# Patient Record
Sex: Female | Born: 1950 | Race: White | Hispanic: No | Marital: Married
Health system: Southern US, Community
[De-identification: ages and names within clinical notes are randomized; demographics above are authoritative.]

---

## 2005-02-01 ENCOUNTER — Ambulatory Visit: Payer: Self-pay

## 2005-09-02 ENCOUNTER — Ambulatory Visit: Payer: Self-pay | Admitting: Family Medicine

## 2006-06-29 ENCOUNTER — Ambulatory Visit (HOSPITAL_COMMUNITY): Admission: RE | Admit: 2006-06-29 | Discharge: 2006-06-29 | Payer: Self-pay | Admitting: Gastroenterology

## 2007-09-28 ENCOUNTER — Ambulatory Visit: Payer: Self-pay | Admitting: Family Medicine

## 2009-04-28 ENCOUNTER — Ambulatory Visit: Payer: Self-pay | Admitting: Family Medicine

## 2009-05-13 ENCOUNTER — Ambulatory Visit: Payer: Self-pay | Admitting: Rheumatology

## 2009-08-01 ENCOUNTER — Inpatient Hospital Stay (HOSPITAL_COMMUNITY): Admission: RE | Admit: 2009-08-01 | Discharge: 2009-08-02 | Payer: Medicare Other | Admitting: Neurosurgery

## 2011-03-13 LAB — HEPATIC FUNCTION PANEL
Albumin: 4 g/dL (ref 3.5–5.2)
Total Protein: 7.6 g/dL (ref 6.0–8.3)

## 2011-03-13 LAB — GLUCOSE, CAPILLARY
Glucose-Capillary: 157 mg/dL — ABNORMAL HIGH (ref 70–99)
Glucose-Capillary: 188 mg/dL — ABNORMAL HIGH (ref 70–99)
Glucose-Capillary: 273 mg/dL — ABNORMAL HIGH (ref 70–99)

## 2011-03-13 LAB — BASIC METABOLIC PANEL
BUN: 25 mg/dL — ABNORMAL HIGH (ref 6–23)
CO2: 29 mEq/L (ref 19–32)
Calcium: 10.2 mg/dL (ref 8.4–10.5)
Creatinine, Ser: 1.06 mg/dL (ref 0.4–1.2)
GFR calc Af Amer: >60 mL/min (ref >60)
Glucose, Bld: 174 mg/dL — ABNORMAL HIGH (ref 70–99)

## 2011-03-13 LAB — CBC
MCHC: 33.2 g/dL (ref 30.0–36.0)
RDW: 15.6 % — ABNORMAL HIGH (ref 11.5–15.5)

## 2011-04-20 NOTE — Op Note (Signed)
[name], [name]               ACCOUNT NO.:  [id number]   MEDICAL RECORD NO.:  [id number]          PATIENT TYPE:  INP   LOCATION:  3524                         FACILITY:  [facility]   PHYSICIAN:  Reinaldo Meeker, M.D. 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   PROCEDURE:  Left T6-T7 transpedicular diskectomy with the operating  microscope.   SURGEON:  Reinaldo Meeker, MD   ASSISTANT:  Kathaleen Maser. Pool, MD   PROCEDURE IN DETAIL:  After placing in the prone position, the patient's  back was prepped and draped in the usual sterile fashion.  Localized  fluoroscopy was used to identify the appropriate level.  A linear  incision was made over the spinous processes of T6 and T7.  Using Bovie  cutting current, the incision was carried down to the spinous processes.  Subperiosteal dissection was then carried out alongside of the left-  sided spinous processes and lamina.  Self-retaining retractor was placed  for exposure.  Fluoroscopy showed approach to be at the appropriate  level.  High-speed drill was then used to perform a generous laminotomy  by removing the inferior one-half of the T6 lamina, the medial third of  the facet joint, and superior one-half of the T7 segment.  Ligamentum  flavum was removed.  Additional bone removal was carried out towards the  foramen to give access to the lateral aspect of the thecal sac.  At this  time, the microscope was draped, brought in the field, and used for the  remainder of the case.  Using microsection technique, the lateral aspect  of the thecal sac could be identified.  There was a huge amount of disk  material directly beneath it, elevated and pushed medially.  We began  to  remove this in a piecemeal fashion and a very large amount of herniated  disk material was removed and as we did more, the spinal dura returned  to a normal shape, size, and position.  We inspected all of them, went  down to the pedicle at T7, removed the disk from down there, and also  went up higher removing disk at all directions including the medial  underneath the thecal sac.  At this time, inspection was carried out in  all directions for any evidence of residual compression, none could be  identified.  Large amounts of irrigation was carried out.  Any bleeding  was controlled with bipolar coagulation and Gelfoam.  The wound was then  closed in multiple layers of Vicryl in the muscle, fascia, subcutaneous,  and subcuticular tissues.  The staples were placed on the skin.  Sterile  dressing was then applied.  The patient was extubated and taken to the  recovery room in stable condition.           ______________________________  Reinaldo Meeker, M.D.  ROK/MEDQ  D:  08/01/2009  T:  08/02/2009  Job:  [id number]

## 2011-04-23 NOTE — Op Note (Signed)
[name], [name]               ACCOUNT NO.:  [id number]   MEDICAL RECORD NO.:  [id number]          PATIENT TYPE:  AMB   LOCATION:  ENDO                         FACILITY:  [facility]   PHYSICIAN:  James L. Malon Kindle., M.D.DATE OF BIRTH:  14-May-1951   DATE OF PROCEDURE:  DATE OF DISCHARGE:                                 OPERATIVE REPORT   PROCEDURE:  Colonoscopy.   MEDICATIONS:  Fentanyl 125 mcg, Versed 12 mg IV.   INDICATIONS FOR PROCEDURE:  Is for screening.   DESCRIPTION OF PROCEDURE:  Procedure explained to the patient and consent  was obtained with the patient in the left lateral decubitus the Olympus  scope was inserted and advanced.  The prep was excellent.  We reached the  cecum using abdominal pressure and position changes.  The terminal ileum was  entered for short distance scope withdrawn.  Cecum, ascending, transverse,  descending and sigmoid colon seemed well.  Mucosa was normal.  No polyps  were seen.  There was no significant diverticular disease.  Rectum was free  of polyps.  The scope was retroflexed with no significant findings.  Scope  withdrawn.  Patient tolerated the procedure well.   ASSESSMENT:  Normal screening colonoscopy.  V76.51.   PLAN:  Routine follow up with yearly hemoccult to begin in five years and I  will possibly repeat colonoscopy in ten years.  She will maintain a high  fiber diet for her constipation.           ______________________________  Llana Aliment Malon Kindle., M.D.     Waldron Session  D:  06/29/2006  T:  06/30/2006  Job:  [id number]   cc:   Lorie Phenix  Fax: [phone]

## 2012-07-17 ENCOUNTER — Ambulatory Visit: Payer: Self-pay | Admitting: Ophthalmology

## 2012-07-17 DIAGNOSIS — I10 Essential (primary) hypertension: Secondary | ICD-10-CM

## 2012-07-24 ENCOUNTER — Ambulatory Visit: Payer: Self-pay | Admitting: Anesthesiology

## 2012-07-31 ENCOUNTER — Ambulatory Visit: Payer: Self-pay | Admitting: Ophthalmology

## 2013-10-26 ENCOUNTER — Ambulatory Visit: Payer: Self-pay | Admitting: Family Medicine

## 2013-11-30 IMAGING — CR DG CHEST 2V
1 series · 2 of 2 positions shown · non-contrast
Comparison: None

CLINICAL DATA: History of sarcoid

EXAM:
CHEST  2 VIEW

[Series 1: pa · 0.17mm/px · 2 of 2 slices shown]
[im 1/2]
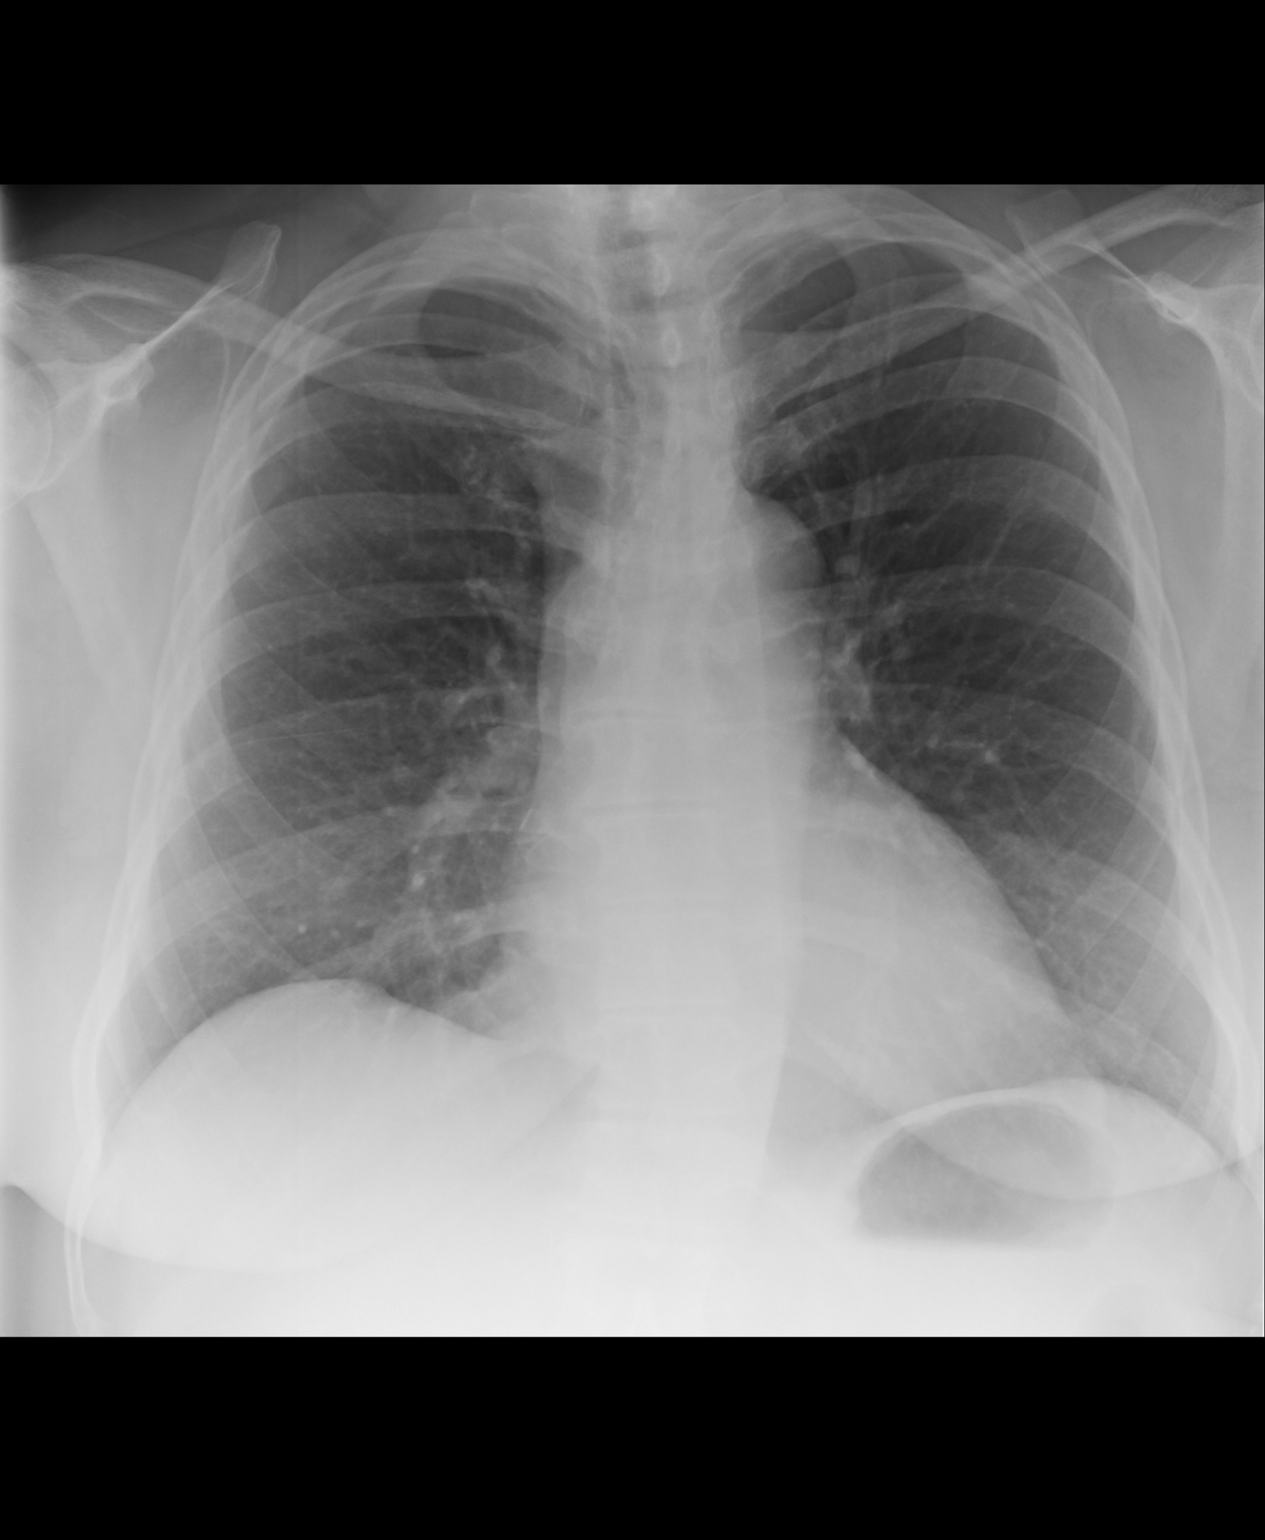
[im 2/2]
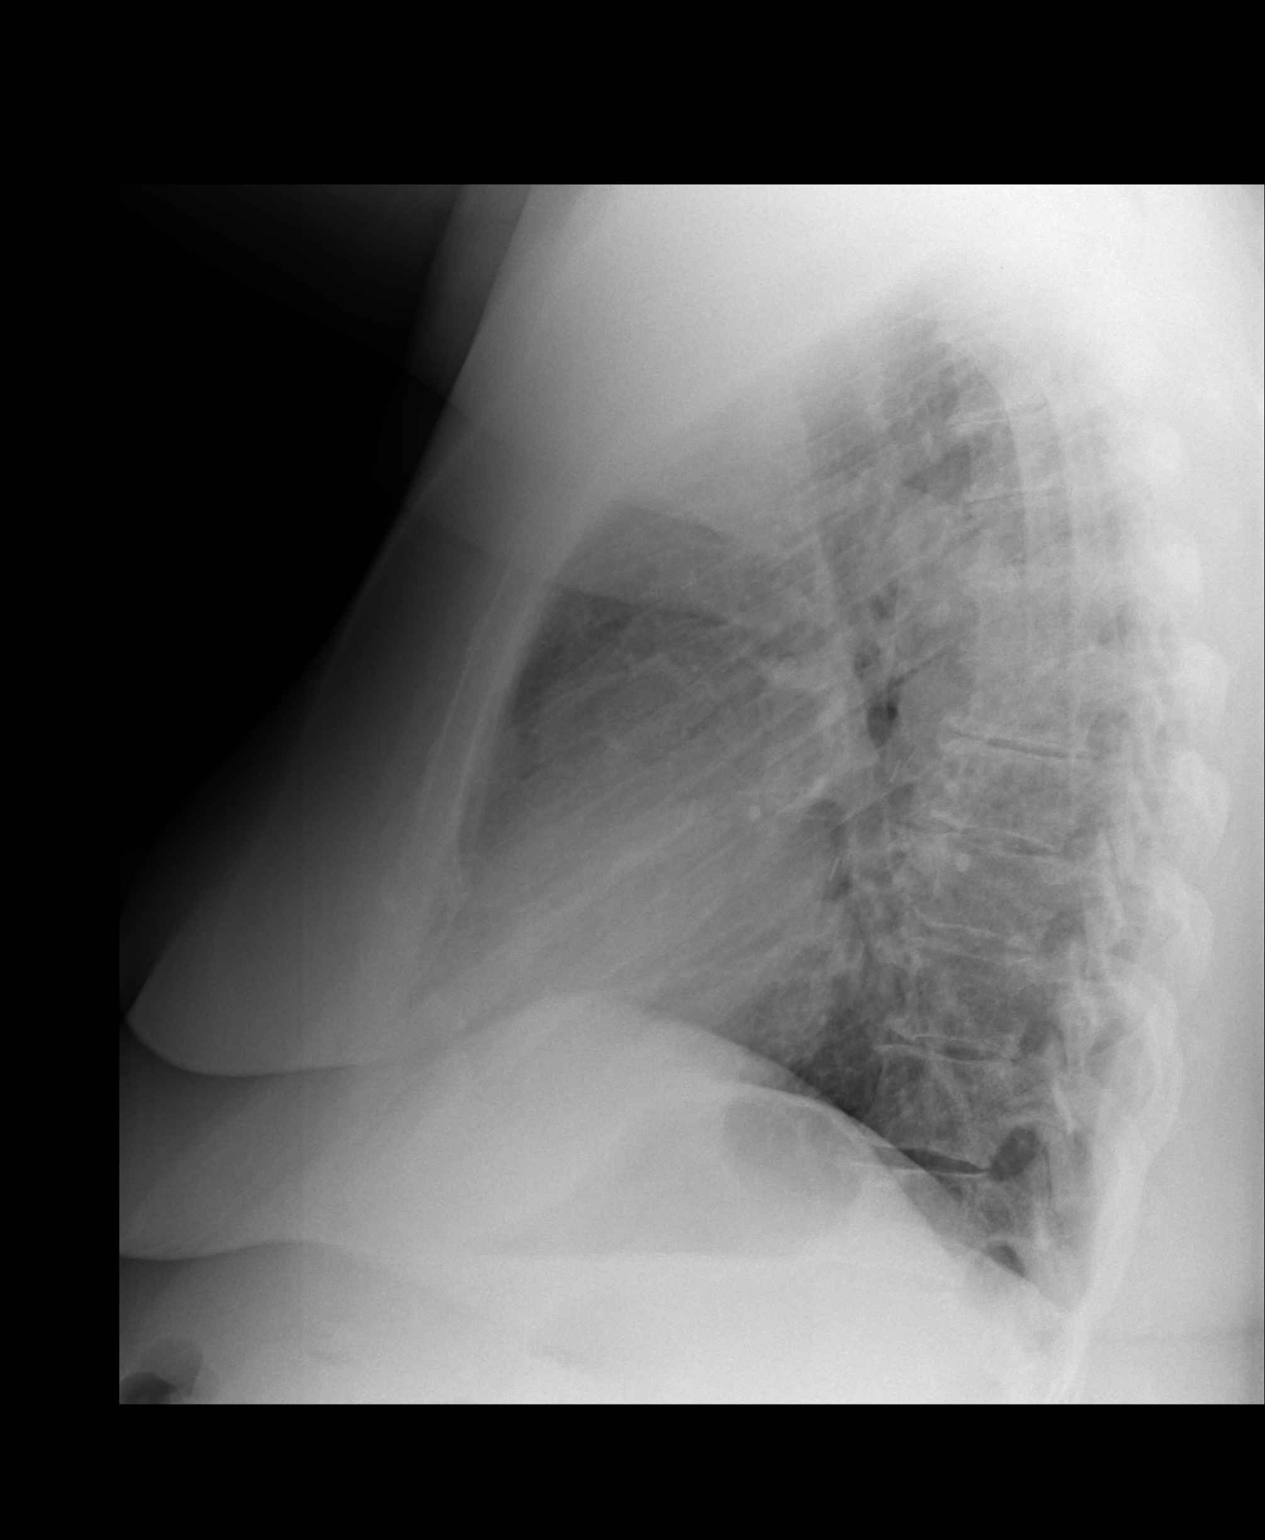

[2 of 2 positions shown; findings below may reference images not displayed]

FINDINGS: The heart size and mediastinal contours are within normal limits.
Both lungs are clear. Degenerative disc disease noted within the
thoracic spine.
IMPRESSION: No active cardiopulmonary disease.

## 2013-12-08 ENCOUNTER — Emergency Department: Payer: Self-pay | Admitting: Emergency Medicine

## 2013-12-08 LAB — CBC WITH DIFFERENTIAL/PLATELET
BASOS PCT: 0.9 %
Basophil #: 0.1 10*3/uL (ref 0.0–0.1)
EOS ABS: 0.1 10*3/uL (ref 0.0–0.7)
Eosinophil %: 1.4 %
HCT: 37.7 % (ref 35.0–47.0)
HGB: 12.5 g/dL (ref 12.0–16.0)
Lymphocyte #: 2.2 10*3/uL (ref 1.0–3.6)
Lymphocyte %: 30 %
MCH: 27.7 pg (ref 26.0–34.0)
MCHC: 33.2 g/dL (ref 32.0–36.0)
MCV: 84 fL (ref 80–100)
Monocyte #: 0.4 x10 3/mm (ref 0.2–0.9)
Monocyte %: 5.8 %
NEUTROS ABS: 4.6 10*3/uL (ref 1.4–6.5)
NEUTROS PCT: 61.9 %
PLATELETS: 256 10*3/uL (ref 150–440)
RBC: 4.52 10*6/uL (ref 3.80–5.20)
RDW: 15 % — AB (ref 11.5–14.5)
WBC: 7.5 10*3/uL (ref 3.6–11.0)

## 2013-12-08 LAB — BASIC METABOLIC PANEL
Anion Gap: 5 — ABNORMAL LOW (ref 7–16)
BUN: 34 mg/dL — ABNORMAL HIGH (ref 7–18)
CHLORIDE: 101 mmol/L (ref 98–107)
CO2: 26 mmol/L (ref 21–32)
Calcium, Total: 9 mg/dL (ref 8.5–10.1)
Creatinine: 1.53 mg/dL — ABNORMAL HIGH (ref 0.60–1.30)
EGFR (African American): 42 — ABNORMAL LOW
GFR CALC NON AF AMER: 36 — AB
GLUCOSE: 266 mg/dL — AB (ref 65–99)
Osmolality: 281 (ref 275–301)
POTASSIUM: 4.6 mmol/L (ref 3.5–5.1)
Sodium: 132 mmol/L — ABNORMAL LOW (ref 136–145)

## 2013-12-08 LAB — TROPONIN I

## 2014-01-06 DEATH — deceased

## 2015-03-25 NOTE — Op Note (Signed)
PATIENT NAME:  [name], Kathryn G MR#:  [id number] DATE OF BIRTH:  04/01/1951  DATE OF PROCEDURE:  07/31/2012  PREOPERATIVE DIAGNOSIS: Cataract, right eye.   POSTOPERATIVE DIAGNOSIS: Cataract, right eye.   PROCEDURE PERFORMED: Extracapsular cataract extraction using phacoemulsification with placement of an Alcon SN6CWS, 21.5-diopter posterior chamber lens, serial # [id number].   SURGEON: Maylon PeppersSteven A. Chatham Howington, M.D.   ANESTHESIA: 4% lidocaine and 0.75% Marcaine in a 50-50 mixture with 10 units/mL of Hylenex added, given as a peribulbar.   ANESTHESIOLOGIST: Dr. Dimple Caseyice   COMPLICATIONS: None.   ESTIMATED BLOOD LOSS: Less than 1 mL.   DESCRIPTION OF PROCEDURE:  The patient was brought to the operating room and given a peribulbar block.  The patient was then prepped and draped in the usual fashion.  The vertical rectus muscles were imbricated using 5-0 silk sutures.  These sutures were then clamped to the sterile drapes as bridle sutures.  A limbal peritomy was performed extending two clock hours and hemostasis was obtained with cautery.  A partial thickness scleral groove was made at the surgical limbus and dissected anteriorly in a lamellar dissection using an Alcon crescent knife.  The anterior chamber was entered superonasally with a Superblade and through the lamellar dissection with a 2.6 mm keratome.  DisCoVisc was used to replace the aqueous and a continuous tear capsulorrhexis was carried out.  Hydrodissection and hydrodelineation were carried out with balanced salt and a 27 gauge canula.  The nucleus was rotated to confirm the effectiveness of the hydrodissection.  Phacoemulsification was carried out using a divide-and-conquer technique.  Total ultrasound time was 1 minute with an average power of 19.5 percent.  CDE 20.69.  Irrigation/aspiration was used to remove the residual cortex.  DisCoVisc was used to inflate the capsule and the internal incision was enlarged to 3 mm with the crescent  knife.  The intraocular lens was folded and inserted into the capsular bag using the Acrysert delivery system.  Irrigation/aspiration was used to remove the residual DisCoVisc.  Miostat was injected into the anterior chamber through the paracentesis track to inflate the anterior chamber and induce miosis.  The wound was checked for leaks and none were found. The conjunctiva was closed with cautery and the bridle sutures were removed.  Two drops of 0.3% Vigamox were placed on the eye.   An eye shield was placed on the eye.  The patient was discharged to the recovery room in good condition.   ____________________________ Maylon PeppersSteven A. [name], MD sad:bjt D: 07/31/2012 13:50:48 ET T: 07/31/2012 14:04:54 ET JOB#: [id number]  cc: Viviann SpareSteven A. Armya Westerhoff, MD, <Dictator> Erline LevineSTEVEN A [name] MD ELECTRONICALLY SIGNED 08/04/2012 12:40
